# Patient Record
Sex: Male | Born: 1995 | Race: Black or African American | Hispanic: No | Marital: Single | Smoking: Never smoker
Health system: Southern US, Community
[De-identification: ages and names within clinical notes are randomized; demographics above are authoritative.]

---
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
## 2014-01-13 ENCOUNTER — Encounter (HOSPITAL_COMMUNITY): Payer: Self-pay | Admitting: Emergency Medicine

## 2014-01-13 ENCOUNTER — Emergency Department (HOSPITAL_COMMUNITY)
Admission: EM | Admit: 2014-01-13 | Discharge: 2014-01-13 | Disposition: A | Discharge status: Home / Self Care | Payer: Medicaid Other | Attending: Emergency Medicine | Admitting: Emergency Medicine

## 2014-01-13 DIAGNOSIS — R112 Nausea with vomiting, unspecified: Secondary | ICD-10-CM | POA: Insufficient documentation

## 2014-01-13 DIAGNOSIS — R197 Diarrhea, unspecified: Secondary | ICD-10-CM | POA: Insufficient documentation

## 2014-01-13 DIAGNOSIS — R000 Tachycardia, unspecified: Secondary | ICD-10-CM | POA: Insufficient documentation

## 2014-01-13 LAB — POCT I-STAT, CHEM 8
BUN: 11 mg/dL (ref 6–23)
CREATININE: 1 mg/dL (ref 0.50–1.35)
Calcium, Ion: 1.11 mmol/L — ABNORMAL LOW (ref 1.12–1.23)
Chloride: 101 mEq/L (ref 96–112)
GLUCOSE: 107 mg/dL — AB (ref 70–99)
HCT: 52 % (ref 39.0–52.0)
HEMOGLOBIN: 17.7 g/dL — AB (ref 13.0–17.0)
POTASSIUM: 3.6 meq/L — AB (ref 3.7–5.3)
Sodium: 140 mEq/L (ref 137–147)
TCO2: 23 mmol/L (ref 0–100)

## 2014-01-13 MED ORDER — ONDANSETRON HCL 4 MG/2ML IJ SOLN
4.0000 mg | Freq: Once | INTRAMUSCULAR | Status: AC
  Administered 2014-01-13: 4 mg via INTRAVENOUS
  Filled 2014-01-13: qty 2

## 2014-01-13 MED ORDER — SODIUM CHLORIDE 0.9 % IV BOLUS (SEPSIS)
2000.0000 mL | Freq: Once | INTRAVENOUS | Status: AC
  Administered 2014-01-13: 1000 mL via INTRAVENOUS

## 2014-01-13 MED ORDER — ONDANSETRON 4 MG PO TBDP: 4.0000 mg | ORAL_TABLET | Freq: Three times a day (TID) | ORAL | Status: DC | PRN

## 2014-01-13 NOTE — ED Notes (Signed)
Pt reports nausea better at this time

## 2014-01-13 NOTE — Discharge Instructions (Signed)
Use the Zofran as needed for any further episodes of vomiting Try to eat lightly and follow the diet for diarrhea for 1-2 days

## 2014-01-13 NOTE — ED Notes (Signed)
Pt c/o n/v/d x 1 days. Emesis x 2 with multiple episodes of diarrhea. Pt reports good output but little intake. BS active.

## 2014-01-13 NOTE — ED Provider Notes (Addendum)
CSN: [id number]     Arrival date & time 01/13/14  0309 History   First MD Initiated Contact with Patient 01/13/14 0321     Chief Complaint  Patient presents with  . Nausea  . Emesis     (Consider location/radiation/quality/duration/timing/severity/associated sxs/prior Treatment) HPI Comments: Alejandro Olson is an 18 year old male presents with 20 hours of nausea, vomiting, and diarrhea.  His last episode of vomiting was approximately 4 hours ago.  His last loose bowel movement was just prior to his arrival.  He has not taken any over-the-counter medication to control his symptoms.  He denies any fever, abdominal pain, previous history of the same is unaware of any ill contacts.  Has not traveled in the country  Patient is a 18 y.o. male presenting with vomiting. The history is provided by the patient.  Emesis Severity:  Moderate Duration:  20 hours Timing:  Intermittent Quality:  Bilious material Progression:  Unchanged Chronicity:  New Recent urination:  Normal Relieved by:  None tried Worsened by:  Liquids Ineffective treatments:  None tried Associated symptoms: diarrhea   Associated symptoms: no abdominal pain, no cough, no fever and no myalgias   Diarrhea:    Quality:  Semi-solid   Severity:  Moderate   Duration:  20 hours   Timing:  Intermittent   Progression:  Unchanged   History reviewed. No pertinent past medical history. History reviewed. No pertinent past surgical history. History reviewed. No pertinent family history. History  Substance Use Topics  . Smoking status: Never Smoker   . Smokeless tobacco: Not on file  . Alcohol Use: No    Review of Systems  Constitutional: Negative for fever.  Respiratory: Negative for cough.   Gastrointestinal: Positive for nausea, vomiting and diarrhea. Negative for abdominal pain.  Musculoskeletal: Negative for myalgias.  Skin: Negative for rash and wound.  All other systems reviewed and are negative.      Allergies   Review of patient's allergies indicates no known allergies.  Home Medications   Current Outpatient Rx  Name  Route  Sig  Dispense  Refill  . ondansetron (ZOFRAN ODT) 4 MG disintegrating tablet   Oral   Take 1 tablet (4 mg total) by mouth every 8 (eight) hours as needed for nausea or vomiting.   20 tablet   0    BP 127/67  Pulse 115  Temp(Src) 98.7 F (37.1 C) (Oral)  Resp 18  SpO2 97% Physical Exam  Vitals reviewed. Constitutional: He appears well-developed and well-nourished.  HENT:  Head: Normocephalic.  Eyes: Pupils are equal, round, and reactive to light.  Neck: Normal range of motion.  Cardiovascular: Regular rhythm.  Tachycardia present.   Pulmonary/Chest: Effort normal and breath sounds normal.  Abdominal: Soft. He exhibits no distension. There is no tenderness.  Musculoskeletal: Normal range of motion.  Skin: Skin is warm. No rash noted. No pallor.    ED Course  Procedures (including critical care time) Labs Review Labs Reviewed  POCT I-STAT, CHEM 8 - Abnormal; Notable for the following:    Potassium 3.6 (*)    Glucose, Bld 107 (*)    Calcium, Ion 1.11 (*)    Hemoglobin 17.7 (*)    All other components within normal limits   Imaging Review No results found.  EKG Interpretation   None       MDM   Final diagnoses:  Nausea vomiting and diarrhea   Will hydrate, given Zofran and IV fluids    This is tolerating fluids, feels,  better.  He's had no further episodes of vomiting, or diarrhea.  Since his been seen in the emergency, department.  He'll be discharged him with Zofran instructions for diarrhea diet   Arman FilterGail K Osborn Pullin, NP 01/13/14 0402  Arman FilterGail K Sybel Standish, NP 01/13/14 [phone]

## 2014-01-13 NOTE — ED Notes (Signed)
Pt arrived to the ED with a complaint of nausea, emesis and diarrhea.  Pt states he has had these symptoms fr about a day.  Pt states he has had any oral intake in that amount of time as well.

## 2014-01-13 NOTE — ED Provider Notes (Signed)
Medical screening examination/treatment/procedure(s) were performed by non-physician practitioner and as supervising physician I was immediately available for consultation/collaboration.  EKG Interpretation   None         Gordie Belvin, MD 01/13/14 0658 

## 2014-01-13 NOTE — ED Provider Notes (Signed)
Medical screening examination/treatment/procedure(s) were performed by non-physician practitioner and as supervising physician I was immediately available for consultation/collaboration.  EKG Interpretation   None         Loyd Marhefka, MD 01/13/14 0549 

## 2014-06-21 ENCOUNTER — Encounter (HOSPITAL_COMMUNITY): Payer: Self-pay | Admitting: Emergency Medicine

## 2014-06-21 ENCOUNTER — Emergency Department (HOSPITAL_COMMUNITY)
Admission: EM | Admit: 2014-06-21 | Discharge: 2014-06-21 | Disposition: A | Discharge status: Home / Self Care | Payer: Medicaid Other | Attending: Emergency Medicine | Admitting: Emergency Medicine

## 2014-06-21 ENCOUNTER — Emergency Department (HOSPITAL_COMMUNITY): Payer: Medicaid Other

## 2014-06-21 DIAGNOSIS — S6990XA Unspecified injury of unspecified wrist, hand and finger(s), initial encounter: Secondary | ICD-10-CM | POA: Diagnosis present

## 2014-06-21 DIAGNOSIS — Y92838 Other recreation area as the place of occurrence of the external cause: Secondary | ICD-10-CM

## 2014-06-21 DIAGNOSIS — W1801XA Striking against sports equipment with subsequent fall, initial encounter: Secondary | ICD-10-CM | POA: Insufficient documentation

## 2014-06-21 DIAGNOSIS — S63502A Unspecified sprain of left wrist, initial encounter: Secondary | ICD-10-CM

## 2014-06-21 DIAGNOSIS — Y9239 Other specified sports and athletic area as the place of occurrence of the external cause: Secondary | ICD-10-CM | POA: Insufficient documentation

## 2014-06-21 DIAGNOSIS — S63509A Unspecified sprain of unspecified wrist, initial encounter: Secondary | ICD-10-CM | POA: Insufficient documentation

## 2014-06-21 DIAGNOSIS — Y9367 Activity, basketball: Secondary | ICD-10-CM | POA: Insufficient documentation

## 2014-06-21 MED ORDER — IBUPROFEN 600 MG PO TABS: 600.0000 mg | ORAL_TABLET | Freq: Four times a day (QID) | ORAL | Status: DC | PRN

## 2014-06-21 NOTE — ED Provider Notes (Signed)
CSN: [id number]     Arrival date & time 06/21/14  1539 History  This chart was scribed for non-physician practitioner working with Rolan BuccoMelanie Belfi, MD by Elveria Risingimelie Horne, ED Scribe. This patient was seen in room TR05C/TR05C and the patient's care was started at 4:11 PM.   Chief Complaint  Patient presents with  . Hand Injury      The history is provided by the patient. No language interpreter was used.   HPI Comments: Marzetta MerinoMatthew D Sonoda is a 18 y.o. male who presents to the Emergency Department with a left hand injury that occurred one week ago. Patient reports falling directly on his hand while playing basketball. Patient reports that the pain resolved but recently represented, prompting his ED visit today.  Patient reports exacerbated pain with movement and picking up objects. Keeping it still makes it better. Patient has not taken any pain medication but has treated the hand with ice. Patient denies previous injury to the hand.   History reviewed. No pertinent past medical history. History reviewed. No pertinent past surgical history. History reviewed. No pertinent family history. History  Substance Use Topics  . Smoking status: Never Smoker   . Smokeless tobacco: Not on file  . Alcohol Use: No    Review of Systems  Constitutional: Negative for fever.  Musculoskeletal: Positive for arthralgias.  Neurological: Negative for weakness and numbness.      Allergies  Review of patient's allergies indicates no known allergies.  Home Medications   Prior to Admission medications   Medication Sig Start Date End Date Taking? Authorizing Provider  ondansetron (ZOFRAN ODT) 4 MG disintegrating tablet Take 1 tablet (4 mg total) by mouth every 8 (eight) hours as needed for nausea or vomiting. 01/13/14   Arman FilterGail K Schulz, NP   Triage Vitals: BP 123/64  Pulse 60  Temp(Src) 98.6 F (37 C) (Oral)  Resp 20  Wt 189 lb (85.73 kg)  SpO2 100% Physical Exam  Nursing note and vitals  reviewed. Constitutional: He is oriented to person, place, and time. He appears well-developed and well-nourished. No distress.  HENT:  Head: Normocephalic and atraumatic.  Eyes: EOM are normal.  Neck: Neck supple.  Cardiovascular: Normal rate.   Pulmonary/Chest: Effort normal.  Musculoskeletal: Normal range of motion. He exhibits tenderness.  Swelling noted to the dorsal left wrist. Tender to palpation over dorsal wrist. Pain with any flexion or extension at the wrist joint. Tender over the scaphoid bone. Pain with range of motion of the thumb at MCP joint. Normal hand otherwise, with no tenderness over metacarpals or phalanges. Normal sensation of the hand distally with cap refill less than 2 seconds distally.  Neurological: He is alert and oriented to person, place, and time.  Skin: Skin is warm and dry.  Psychiatric: He has a normal mood and affect. His behavior is normal.    ED Course  Procedures (including critical care time) COORDINATION OF CARE: 4:14 PM- Will order X-ray. Discussed treatment plan with patient at bedside and patient agreed to plan.   Labs Review Labs Reviewed - No data to display  Imaging Review Dg Wrist Complete Left  06/21/2014   CLINICAL DATA:  Larey SeatFell this afternoon, pain at base of thumb to distal radius  EXAM: LEFT WRIST - COMPLETE 3+ VIEW  COMPARISON:  None.  FINDINGS: There is no evidence of fracture or dislocation. There is no evidence of arthropathy or other focal bone abnormality. Soft tissues are unremarkable.  IMPRESSION: Negative.   Electronically Signed  By: Esperanza Heir M.D.   On: 06/21/2014 16:47     EKG Interpretation None      MDM   Final diagnoses:  Wrist sprain, left, initial encounter    Pt with left wrist injury 1 wk ago. Xrays negative. He is neurovascularly intact. He is tender over scaphoid, however injury occurred a week ago and today's films are negative. Will splint in velcro thumb spika. Follow up with hand orthopedics.    Filed Vitals:   06/21/14 1552  BP: 123/64  Pulse: 60  Temp: 98.6 F (37 C)  Resp: 20     I personally performed the services described in this documentation, which was scribed in my presence. The recorded information has been reviewed and is accurate.    Lottie Mussel, PA-C 06/21/14 1709

## 2014-06-21 NOTE — Discharge Instructions (Signed)
Keep splint on. Continue to ice, elevate. Ibuprofen for pain. Follow up with hand orthopedics specialist as referred if not improving.    Joint Sprain A sprain is a tear or stretch in the ligaments that hold a joint together. Severe sprains may need as long as 3-6 weeks of immobilization and/or exercises to heal completely. Sprained joints should be rested and protected. If not, they can become unstable and prone to re-injury. Proper treatment can reduce your pain, shorten the period of disability, and reduce the risk of repeated injuries. TREATMENT   Rest and elevate the injured joint to reduce pain and swelling.  Apply ice packs to the injury for 20-30 minutes every 2-3 hours for the next 2-3 days.  Keep the injury wrapped in a compression bandage or splint as long as the joint is painful or as instructed by your caregiver.  Do not use the injured joint until it is completely healed to prevent re-injury and chronic instability. Follow the instructions of your caregiver.  Long-term sprain management may require exercises and/or treatment by a physical therapist. Taping or special braces may help stabilize the joint until it is completely better. SEEK MEDICAL CARE IF:   You develop increased pain or swelling of the joint.  You develop increasing redness and warmth of the joint.  You develop a fever.  It becomes stiff.  Your hand or foot gets cold or numb. Document Released: 12/22/2004 Document Revised: 02/06/2012 Document Reviewed: 12/01/2008 [facility] Patient Information 2015 [place], [place]. This information is not intended to replace advice given to you by your health care provider. Make sure you discuss any questions you have with your health care provider.

## 2014-06-21 NOTE — ED Notes (Signed)
Pt in stating that he fell on his left hand while playing basketball last week, reports intermittent pain since that time, no deformity noted

## 2014-06-21 NOTE — ED Notes (Signed)
Declined W/C at D/C and was escorted to lobby by RN. 

## 2014-06-22 NOTE — ED Provider Notes (Signed)
Medical screening examination/treatment/procedure(s) were performed by non-physician practitioner and as supervising physician I was immediately available for consultation/collaboration.   EKG Interpretation None        Anquanette Bahner, MD 06/22/14 0008 

## 2018-07-23 ENCOUNTER — Ambulatory Visit (HOSPITAL_COMMUNITY)
Admission: EM | Admit: 2018-07-23 | Discharge: 2018-07-23 | Disposition: A | Discharge status: Home / Self Care | Payer: Self-pay | Attending: Physician Assistant | Admitting: Physician Assistant

## 2018-07-23 ENCOUNTER — Encounter (HOSPITAL_COMMUNITY): Payer: Self-pay

## 2018-07-23 DIAGNOSIS — Z113 Encounter for screening for infections with a predominantly sexual mode of transmission: Secondary | ICD-10-CM

## 2018-07-23 DIAGNOSIS — Z202 Contact with and (suspected) exposure to infections with a predominantly sexual mode of transmission: Secondary | ICD-10-CM | POA: Insufficient documentation

## 2018-07-23 MED ORDER — AZITHROMYCIN 250 MG PO TABS
1000.0000 mg | ORAL_TABLET | Freq: Once | ORAL | Status: AC
  Administered 2018-07-23: 1000 mg via ORAL

## 2018-07-23 MED ORDER — LIDOCAINE HCL (PF) 1 % IJ SOLN
INTRAMUSCULAR | Status: AC
  Filled 2018-07-23: qty 2

## 2018-07-23 MED ORDER — AZITHROMYCIN 250 MG PO TABS
ORAL_TABLET | ORAL | Status: AC
  Filled 2018-07-23: qty 4

## 2018-07-23 MED ORDER — CEFTRIAXONE SODIUM 250 MG IJ SOLR
INTRAMUSCULAR | Status: AC
  Filled 2018-07-23: qty 250

## 2018-07-23 MED ORDER — CEFTRIAXONE SODIUM 250 MG IJ SOLR
250.0000 mg | Freq: Once | INTRAMUSCULAR | Status: AC
Start: 2018-07-23 — End: 2018-07-23
  Administered 2018-07-23: 250 mg via INTRAMUSCULAR

## 2018-07-23 NOTE — ED Triage Notes (Signed)
Pt presents to have a STD testing after exposure with partner; not displaying any symptoms.

## 2018-07-23 NOTE — ED Provider Notes (Signed)
[facility]    CSN: [id number] Arrival date & time: 07/23/18  1600     History   Chief Complaint Chief Complaint  Patient presents with  . Exposure to STD    HPI IRVAN TIEDT is a 22 y.o. male.   22 year old male comes in for STD testing.  Patient states partner was tested positive for chlamydia.  He is asymptomatic.  Denies fever, chills, night sweats.  Denies abdominal pain, nausea, vomiting.  Denies penile discharge, testicular swelling, testicular pain, penile lesion.  Denies urinary symptoms such as frequency, dysuria, hematuria.  He is sexually active with one male partner, no condom use.     History reviewed. No pertinent past medical history.  There are no active problems to display for this patient.   History reviewed. No pertinent surgical history.     Home Medications    Prior to Admission medications   Medication Sig Start Date End Date Taking? Authorizing Provider  ibuprofen (ADVIL,MOTRIN) 600 MG tablet Take 1 tablet (600 mg total) by mouth every 6 (six) hours as needed. 06/21/14   Kirichenko, Tatyana, PA-C  ondansetron (ZOFRAN ODT) 4 MG disintegrating tablet Take 1 tablet (4 mg total) by mouth every 8 (eight) hours as needed for nausea or vomiting. 01/13/14   Earley Favor, NP    Family History History reviewed. No pertinent family history.  Social History Social History   Tobacco Use  . Smoking status: Never Smoker  Substance Use Topics  . Alcohol use: No  . Drug use: No     Allergies   Patient has no known allergies.   Review of Systems Review of Systems  Reason unable to perform ROS: See HPI as above.     Physical Exam Triage Vital Signs ED Triage Vitals  Enc Vitals Group     BP 07/23/18 1623 128/78     Pulse Rate 07/23/18 1623 61     Resp 07/23/18 1623 20     Temp 07/23/18 1623 98.3 F (36.8 C)     Temp Source 07/23/18 1623 Temporal     SpO2 07/23/18 1623 100 %     Weight --      Height --      Head  Circumference --      Peak Flow --      Pain Score 07/23/18 1622 0     Pain Loc --      Pain Edu? --      Excl. in GC? --    No data found.  Updated Vital Signs BP 128/78 (BP Location: Right Arm)   Pulse 61   Temp 98.3 F (36.8 C) (Temporal)   Resp 20   SpO2 100%   Physical Exam  Constitutional: He is oriented to person, place, and time. He appears well-developed and well-nourished. No distress.  HENT:  Head: Normocephalic and atraumatic.  Eyes: Pupils are equal, round, and reactive to light. Conjunctivae are normal.  Neurological: He is alert and oriented to person, place, and time.  Skin: He is not diaphoretic.     UC Treatments / Results  Labs (all labs ordered are listed, but only abnormal results are displayed) Labs Reviewed  URINE CYTOLOGY ANCILLARY ONLY    EKG None  Radiology No results found.  Procedures Procedures (including critical care time)  Medications Ordered in UC Medications  azithromycin (ZITHROMAX) tablet 1,000 mg (1,000 mg Oral Given 07/23/18 1721)  cefTRIAXone (ROCEPHIN) injection 250 mg (250 mg Intramuscular Given 07/23/18 1721)  Initial Impression / Assessment and Plan / UC Course  I have reviewed the triage vital signs and the nursing notes.  Pertinent labs & imaging results that were available during my care of the patient were reviewed by me and considered in my medical decision making (see chart for details).    Patient was treated empirically for GC. Azithromycin and Rocephin given in office today. Cytology sent, patient will be contacted with any positive results that require additional treatment. Patient to refrain from sexual activity for the next 7 days. Return precautions given.   Final Clinical Impressions(s) / UC Diagnoses   Final diagnoses:  Exposure to STD    ED Prescriptions    None        Belinda FisherYu, Jerrine Urschel V, PA-C 07/23/18 1738

## 2018-07-23 NOTE — Discharge Instructions (Signed)
You were treated empirically for gonorrhea, chlamydia. Azithromycin 1g by mouth and Rocephin 250mg  injection given in office today. Cytology sent, you will be contacted with any positive results that requires further treatment. Refrain from sexual activity for the next 7 days. Monitor for any worsening of symptoms, fever, abdominal pain, nausea, vomiting, testicle swelling/pain, penile lesion/sore to follow up for reevaluation.

## 2018-07-24 LAB — URINE CYTOLOGY ANCILLARY ONLY
Chlamydia: NEGATIVE
NEISSERIA GONORRHEA: NEGATIVE
Trichomonas: NEGATIVE

## 2019-11-03 ENCOUNTER — Emergency Department (HOSPITAL_COMMUNITY)
Admission: EM | Admit: 2019-11-03 | Discharge: 2019-11-04 | Disposition: A | Discharge status: Home / Self Care | Payer: Self-pay | Attending: Emergency Medicine | Admitting: Emergency Medicine

## 2019-11-03 ENCOUNTER — Emergency Department (HOSPITAL_COMMUNITY): Payer: Self-pay

## 2019-11-03 ENCOUNTER — Other Ambulatory Visit: Payer: Self-pay

## 2019-11-03 ENCOUNTER — Encounter (HOSPITAL_COMMUNITY): Payer: Self-pay | Admitting: Emergency Medicine

## 2019-11-03 DIAGNOSIS — R0781 Pleurodynia: Secondary | ICD-10-CM | POA: Insufficient documentation

## 2019-11-03 DIAGNOSIS — N342 Other urethritis: Secondary | ICD-10-CM | POA: Insufficient documentation

## 2019-11-03 LAB — URINALYSIS, ROUTINE W REFLEX MICROSCOPIC
Bacteria, UA: NONE SEEN
Bilirubin Urine: NEGATIVE
Glucose, UA: NEGATIVE mg/dL
Ketones, ur: NEGATIVE mg/dL
Nitrite: NEGATIVE
Protein, ur: 100 mg/dL — AB
Specific Gravity, Urine: 1.03 (ref 1.005–1.030)
WBC, UA: >50 WBC/hpf — ABNORMAL HIGH (ref 0–5)
pH: 5 (ref 5.0–8.0)

## 2019-11-03 NOTE — ED Triage Notes (Signed)
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 Pt states pain worse today with at work, pain reproducable with palpation. Denies shob.  Pt states he urinated today he noted bright red streaks in urine, +dysuria.

## 2019-11-04 MED ORDER — IBUPROFEN 800 MG PO TABS: 800.0000 mg | ORAL_TABLET | Freq: Three times a day (TID) | ORAL | 0 refills | Status: DC

## 2019-11-04 MED ORDER — LIDOCAINE HCL (PF) 1 % IJ SOLN
INTRAMUSCULAR | Status: AC
  Filled 2019-11-04: qty 5

## 2019-11-04 MED ORDER — AZITHROMYCIN 250 MG PO TABS
1000.0000 mg | ORAL_TABLET | Freq: Once | ORAL | Status: AC
  Administered 2019-11-04: 1000 mg via ORAL
  Filled 2019-11-04: qty 4

## 2019-11-04 MED ORDER — CEFTRIAXONE SODIUM 250 MG IJ SOLR
250.0000 mg | Freq: Once | INTRAMUSCULAR | Status: AC
  Administered 2019-11-04: 250 mg via INTRAMUSCULAR
  Filled 2019-11-04: qty 250

## 2019-11-04 NOTE — ED Provider Notes (Signed)
[facility] EMERGENCY DEPARTMENT Provider Note   CSN: [id number] Arrival date & time: 11/03/19  2036     History   Chief Complaint Chief Complaint  Patient presents with  . Hematuria    HPI Alejandro Olson is a 23 y.o. male.     Presents to the emergency department with a chief complaint of 2 complaints.  1.  Right rib pain: Patient works at [employer], he is constantly moving and twisting and lifting boxes.  He states that he noticed he was having some pain on his right ribs yesterday.  It is reproducible with palpation.  It is worsened with movement.  He denies any known injury.  He denies any fever, chills, cough, or shortness of breath.  He denies any abdominal pain.  2.  Hematuria: Patient reports penile discharge and hematuria that started 2 days ago.  He is sexually active.  Denies any abdominal pain.  Denies any fevers or chills.  Denies any treatments prior to arrival.  The history is provided by the patient. No language interpreter was used.    History reviewed. No pertinent past medical history.  There are no active problems to display for this patient.   History reviewed. No pertinent surgical history.      Home Medications    Prior to Admission medications   Medication Sig Start Date End Date Taking? Authorizing Provider  ibuprofen (ADVIL,MOTRIN) 600 MG tablet Take 1 tablet (600 mg total) by mouth every 6 (six) hours as needed. Patient not taking: Reported on 11/04/2019 06/21/14   Jaynie Crumble, PA-C  ondansetron (ZOFRAN ODT) 4 MG disintegrating tablet Take 1 tablet (4 mg total) by mouth every 8 (eight) hours as needed for nausea or vomiting. Patient not taking: Reported on 11/04/2019 01/13/14   Earley Favor, NP    Family History No family history on file.  Social History Social History   Tobacco Use  . Smoking status: Never Smoker  . Smokeless tobacco: Never Used  Substance Use Topics  . Alcohol use: No  . Drug use: No      Allergies   Patient has no known allergies.   Review of Systems Review of Systems  All other systems reviewed and are negative.    Physical Exam Updated Vital Signs BP (!) 153/84 (BP Location: Right Arm)   Pulse 83   Temp 98.4 F (36.9 C) (Oral)   Resp 16   Ht 6\' 1"  (1.854 m)   Wt 85 kg   SpO2 95%   BMI 24.72 kg/m   Physical Exam Vitals signs and nursing note reviewed.  Constitutional:      General: He is not in acute distress.    Appearance: He is well-developed. He is not ill-appearing.  HENT:     Head: Normocephalic and atraumatic.  Eyes:     Conjunctiva/sclera: Conjunctivae normal.  Neck:     Musculoskeletal: Neck supple.  Cardiovascular:     Rate and Rhythm: Normal rate.  Pulmonary:     Effort: Pulmonary effort is normal. No respiratory distress.     Comments: Lung sounds are clear bilaterally Right anterior chest wall is tender to palpation, but no ecchymosis Abdominal:     General: There is no distension.     Comments: Abdomen is nontender, no Murphy sign  Genitourinary:    Comments: Deferred Musculoskeletal: Normal range of motion.     Comments: Moves all extremities  Skin:    General: Skin is warm and dry.  Comments: No skin rash  Neurological:     Mental Status: He is alert and oriented to person, place, and time.  Psychiatric:        Mood and Affect: Mood normal.        Behavior: Behavior normal.      ED Treatments / Results  Labs (all labs ordered are listed, but only abnormal results are displayed) Labs Reviewed  URINALYSIS, ROUTINE W REFLEX MICROSCOPIC - Abnormal; Notable for the following components:      Result Value   Color, Urine AMBER (*)    APPearance CLOUDY (*)    Hgb urine dipstick MODERATE (*)    Protein, ur 100 (*)    Leukocytes,Ua MODERATE (*)    WBC, UA >50 (*)    All other components within normal limits    EKG None  Radiology Dg Chest 2 View  Result Date: 11/03/2019 CLINICAL DATA:  23 year old male with  right sided chest pain. EXAM: CHEST - 2 VIEW COMPARISON:  Chest radiograph dated 05/08/2005. FINDINGS: The lungs are clear. There is no pleural effusion or pneumothorax. The cardiac silhouette is within normal limits. No acute osseous pathology. IMPRESSION: No active cardiopulmonary disease. Electronically Signed   By: Anner Crete M.D.   On: 11/03/2019 21:25    Procedures Procedures (including critical care time)  Medications Ordered in ED Medications  azithromycin (ZITHROMAX) tablet 1,000 mg (has no administration in time range)  cefTRIAXone (ROCEPHIN) injection 250 mg (has no administration in time range)     Initial Impression / Assessment and Plan / ED Course  I have reviewed the triage vital signs and the nursing notes.  Pertinent labs & imaging results that were available during my care of the patient were reviewed by me and considered in my medical decision making (see chart for details).        Patient with right rib pain.  He works at [employer].  Suspect muscle strain.  Chest x-ray is negative for rib fracture.  No evidence of infection.  Skin is normal in appearance.  His symptoms are reproducible.  Will treat with NSAIDs.  Patient also has hematuria and penile discharge.  Will treat for STDs.  Final Clinical Impressions(s) / ED Diagnoses   Final diagnoses:  Rib pain on right side  Urethritis    ED Discharge Orders         Ordered    ibuprofen (ADVIL) 800 MG tablet  3 times daily     11/04/19 0033           Montine Circle, PA-C 11/04/19 0033    Ward, Delice Bison, DO 11/04/19 [phone]

## 2019-11-04 NOTE — Discharge Instructions (Addendum)
Please take ibuprofen as directed.  If you develop fever, chills, cough, or shortness of breath return to the emergency department.  Additionally if you have upper abdominal pain, nausea, vomiting, return for repeat evaluation.  We believe your symptoms to likely be muscle strain.  You may apply ice.  Be careful lifting.

## 2020-01-02 ENCOUNTER — Encounter (HOSPITAL_COMMUNITY): Payer: Self-pay | Admitting: Emergency Medicine

## 2020-01-02 ENCOUNTER — Emergency Department (HOSPITAL_COMMUNITY): Payer: Medicaid Other

## 2020-01-02 ENCOUNTER — Emergency Department (HOSPITAL_COMMUNITY)
Admission: EM | Admit: 2020-01-02 | Discharge: 2020-01-02 | Disposition: A | Discharge status: Home / Self Care | Payer: Medicaid Other | Attending: Emergency Medicine | Admitting: Emergency Medicine

## 2020-01-02 ENCOUNTER — Other Ambulatory Visit: Payer: Self-pay

## 2020-01-02 DIAGNOSIS — M25561 Pain in right knee: Secondary | ICD-10-CM | POA: Insufficient documentation

## 2020-01-02 DIAGNOSIS — Y929 Unspecified place or not applicable: Secondary | ICD-10-CM | POA: Insufficient documentation

## 2020-01-02 DIAGNOSIS — M545 Low back pain, unspecified: Secondary | ICD-10-CM

## 2020-01-02 DIAGNOSIS — S99912A Unspecified injury of left ankle, initial encounter: Secondary | ICD-10-CM | POA: Insufficient documentation

## 2020-01-02 DIAGNOSIS — R369 Urethral discharge, unspecified: Secondary | ICD-10-CM | POA: Insufficient documentation

## 2020-01-02 DIAGNOSIS — M25562 Pain in left knee: Secondary | ICD-10-CM | POA: Insufficient documentation

## 2020-01-02 DIAGNOSIS — Y999 Unspecified external cause status: Secondary | ICD-10-CM | POA: Insufficient documentation

## 2020-01-02 DIAGNOSIS — X500XXA Overexertion from strenuous movement or load, initial encounter: Secondary | ICD-10-CM | POA: Insufficient documentation

## 2020-01-02 DIAGNOSIS — Y939 Activity, unspecified: Secondary | ICD-10-CM | POA: Insufficient documentation

## 2020-01-02 LAB — URINALYSIS, ROUTINE W REFLEX MICROSCOPIC
Bilirubin Urine: NEGATIVE
Glucose, UA: NEGATIVE mg/dL
Ketones, ur: NEGATIVE mg/dL
Nitrite: NEGATIVE
Protein, ur: 100 mg/dL — AB
Specific Gravity, Urine: 1.03 (ref 1.005–1.030)
WBC, UA: >50 WBC/hpf — ABNORMAL HIGH (ref 0–5)
pH: 5 (ref 5.0–8.0)

## 2020-01-02 MED ORDER — CEFTRIAXONE SODIUM 500 MG IJ SOLR
500.0000 mg | Freq: Once | INTRAMUSCULAR | Status: AC
  Administered 2020-01-02: 22:00:00 500 mg via INTRAMUSCULAR
  Filled 2020-01-02: qty 500

## 2020-01-02 MED ORDER — DOXYCYCLINE HYCLATE 100 MG PO TABS
100.0000 mg | ORAL_TABLET | Freq: Once | ORAL | Status: AC
  Administered 2020-01-02: 22:00:00 100 mg via ORAL
  Filled 2020-01-02: qty 1

## 2020-01-02 MED ORDER — METHOCARBAMOL 500 MG PO TABS: 500.0000 mg | ORAL_TABLET | Freq: Two times a day (BID) | ORAL | 0 refills | Status: DC

## 2020-01-02 MED ORDER — DOXYCYCLINE HYCLATE 100 MG PO CAPS: 100.0000 mg | ORAL_CAPSULE | Freq: Two times a day (BID) | ORAL | 0 refills | Status: AC

## 2020-01-02 MED ORDER — LIDOCAINE 5 % EX PTCH: 1.0000 | MEDICATED_PATCH | CUTANEOUS | 0 refills | Status: DC

## 2020-01-02 NOTE — ED Provider Notes (Signed)
[facility] EMERGENCY DEPARTMENT Provider Note   CSN: [id number] Arrival date & time: 01/02/20  1843     History Chief Complaint  Patient presents with  . Dysuria  . Back Pain    Alejandro Olson is a 24 y.o. male.  HPI      Alejandro Olson is a 23 y.o. male, with a history of chlamydia, presenting to the ED primarily complaining of dysuria and penile discharge for the past 2 days. He is sexually active with male partners. Denies fever/chills, abdominal pain, testicular/scrotal pain/swelling, hematuria, nausea/vomiting, pain with bowel movements.  Patient also complains of lower back pain and bilateral knee pain for about the last 3 weeks.  He also complains of left ankle pain for about the last 2 weeks which occurred after twisting his ankle stepping off the truck.  Patient works at [employer] as a Education officer, community.  Denies saddle anesthesias, numbness, weakness, or any other complaints.   History reviewed. No pertinent past medical history.  There are no problems to display for this patient.   History reviewed. No pertinent surgical history.     History reviewed. No pertinent family history.  Social History   Tobacco Use  . Smoking status: Never Smoker  . Smokeless tobacco: Never Used  Substance Use Topics  . Alcohol use: No  . Drug use: No    Home Medications Prior to Admission medications   Medication Sig Start Date End Date Taking? Authorizing Provider  doxycycline (VIBRAMYCIN) 100 MG capsule Take 1 capsule (100 mg total) by mouth 2 (two) times daily for 7 days. 01/02/20 01/09/20  Zaine Elsass C, PA-C  ibuprofen (ADVIL) 800 MG tablet Take 1 tablet (800 mg total) by mouth 3 (three) times daily. 11/04/19   Montine Circle, PA-C  lidocaine (LIDODERM) 5 % Place 1 patch onto the skin daily. Remove & Discard patch within 12 hours or as directed by MD 01/02/20   Tatiyana Foucher C, PA-C  methocarbamol (ROBAXIN) 500 MG tablet Take 1 tablet (500 mg total) by mouth 2  (two) times daily. 01/02/20   Joeleen Wortley C, PA-C    Allergies    Patient has no known allergies.  Review of Systems   Review of Systems  Constitutional: Negative for chills, diaphoresis and fever.  Respiratory: Negative for shortness of breath.   Cardiovascular: Negative for chest pain.  Gastrointestinal: Negative for abdominal pain, nausea and vomiting.  Genitourinary: Positive for discharge and dysuria. Negative for flank pain, genital sores, penile swelling and scrotal swelling.  Musculoskeletal: Positive for back pain.  Neurological: Negative for weakness and numbness.  All other systems reviewed and are negative.   Physical Exam Updated Vital Signs BP 133/78 (BP Location: Left Arm)   Pulse 77   Temp 100.1 F (37.8 C) (Oral)   Resp 16   SpO2 100%   Physical Exam Vitals and nursing note reviewed.  Constitutional:      General: He is not in acute distress.    Appearance: He is well-developed. He is not diaphoretic.  HENT:     Head: Normocephalic and atraumatic.     Mouth/Throat:     Mouth: Mucous membranes are moist.     Pharynx: Oropharynx is clear.  Eyes:     Conjunctiva/sclera: Conjunctivae normal.  Cardiovascular:     Rate and Rhythm: Normal rate and regular rhythm.     Pulses: Normal pulses.          Radial pulses are 2+ on the right side  and 2+ on the left side.       Posterior tibial pulses are 2+ on the right side and 2+ on the left side.     Heart sounds: Normal heart sounds.     Comments: Tactile temperature in the extremities appropriate and equal bilaterally. Pulmonary:     Effort: Pulmonary effort is normal. No respiratory distress.     Breath sounds: Normal breath sounds.  Abdominal:     Palpations: Abdomen is soft.     Tenderness: There is no abdominal tenderness. There is no guarding.  Genitourinary:    Penis: Discharge present. No erythema, tenderness, swelling or lesions.      Testes: Normal.  Musculoskeletal:     Cervical back: Neck supple.       Lumbar back: Tenderness present.       Back:     Right knee: No swelling, deformity, effusion, erythema or crepitus. Normal range of motion. No tenderness.     Left knee: No swelling, deformity, effusion, erythema or crepitus. Normal range of motion. No tenderness.     Right lower leg: No edema.     Left lower leg: No edema.     Right ankle: Normal.     Left ankle: No swelling, deformity or ecchymosis. Tenderness present over the lateral malleolus and medial malleolus.     Comments: Right knee: No tenderness, swelling, deformity, instability, or pain with range of motion.  Some pain with weightbearing. Left knee: No tenderness, swelling, deformity, instability, or pain with range of motion.  Some pain with weightbearing.  Lymphadenopathy:     Cervical: No cervical adenopathy.     Lower Body: No right inguinal adenopathy. No left inguinal adenopathy.  Skin:    General: Skin is warm and dry.  Neurological:     Mental Status: He is alert.     Comments: Sensation grossly intact to light touch in the lower extremities bilaterally. No saddle anesthesias. Strength 5/5 in the bilateral lower extremities. No noted gait deficit. Coordination intact.  Psychiatric:        Mood and Affect: Mood and affect normal.        Speech: Speech normal.        Behavior: Behavior normal.     ED Results / Procedures / Treatments   Labs (all labs ordered are listed, but only abnormal results are displayed) Labs Reviewed  URINALYSIS, ROUTINE W REFLEX MICROSCOPIC - Abnormal; Notable for the following components:      Result Value   APPearance CLOUDY (*)    Hgb urine dipstick MODERATE (*)    Protein, ur 100 (*)    Leukocytes,Ua MODERATE (*)    WBC, UA >50 (*)    Bacteria, UA FEW (*)    All other components within normal limits  URINE CULTURE  RPR  HIV ANTIBODY (ROUTINE TESTING W REFLEX)  GC/CHLAMYDIA PROBE AMP ([place]) NOT AT [facility]    EKG None  Radiology DG Ankle Complete  Left  Result Date: 01/02/2020 CLINICAL DATA:  Pain. EXAM: LEFT ANKLE COMPLETE - 3+ VIEW COMPARISON:  None. FINDINGS: There is mild soft tissue swelling about the ankle without evidence for an acute displaced fracture or dislocation. IMPRESSION: No evidence for an acute displaced fracture or dislocation. Electronically Signed   By: Katherine Mantle M.D.   On: 01/02/2020 21:52    Procedures Procedures (including critical care time)  Medications Ordered in ED Medications  cefTRIAXone (ROCEPHIN) injection 500 mg (500 mg Intramuscular Given 01/02/20 2213)  doxycycline (  VIBRA-TABS) tablet 100 mg (100 mg Oral Given 01/02/20 2214)    ED Course  I have reviewed the triage vital signs and the nursing notes.  Pertinent labs & imaging results that were available during my care of the patient were reviewed by me and considered in my medical decision making (see chart for details).    MDM Rules/Calculators/A&P                      Patient presents with dysuria and penile discharge.  Suspicion for STD.  Patient treated accordingly.  Patient has atraumatic back and knee pain.  There is not an indication for imaging of these locations.  Possibly traumatic ankle pain.  No acute abnormality on x-ray.  The patient was given instructions for home care as well as return precautions. Patient voices understanding of these instructions, accepts the plan, and is comfortable with discharge.  Final Clinical Impression(s) / ED Diagnoses Final diagnoses:  Acute bilateral low back pain without sciatica  Penile discharge  Acute pain of both knees  Injury of left ankle, initial encounter    Rx / DC Orders ED Discharge Orders         Ordered    doxycycline (VIBRAMYCIN) 100 MG capsule  2 times daily     01/02/20 2131    methocarbamol (ROBAXIN) 500 MG tablet  2 times daily     01/02/20 2131    lidocaine (LIDODERM) 5 %  Every 24 hours     01/02/20 2131           Concepcion Living 01/02/20 2323     Terrilee Files, MD 01/03/20 1017

## 2020-01-02 NOTE — Progress Notes (Signed)
Orthopedic Tech Progress Note Patient Details:  Alejandro Olson 11-18-1996 [id number]  Ortho Devices Type of Ortho Device: Knee Sleeve, ASO Ortho Device/Splint Location: Bilateral Ortho Device/Splint Interventions: Ordered, Application, Adjustment   Post Interventions Patient Tolerated: Well Instructions Provided: Adjustment of device, Poper ambulation with device   Ancil Linsey 01/02/2020, 11:20 PM

## 2020-01-02 NOTE — ED Triage Notes (Signed)
Patient arrives to ED with complaints of two days of dysuria. Patient stated he did have unprotected sex last week but denies redness or discharge. Patient also states he's been having lower back pain, knee pain, and ankle pain for the last month.

## 2020-01-02 NOTE — Discharge Instructions (Addendum)
[facility] on [address]  You have been tested for STDs. Some of these results are still pending. Any abnormalities will be called to you. You have been empirically treated for gonorrhea and chlamydia. This does not mean you necessarily have these diseases, treatment is precautionary. Be sure to follow safe sex practices, including monogamy and/or condom use. All sexual partners must also be notified and treated.  Any future STD testing or treatment should be performed at the [facility] department or primary care office. For the treatment to be fully effective, avoid all sexual contact for at least 2 weeks after medication administration. Otherwise, you will infect your partner(s) and/or risk reinfecting yourself. Should symptoms fail to improve within 1 week, follow up with a primary care provider or go to the [facility].  Back and knee pain Take it easy, but do not lay around too much as this may make any stiffness worse.  Antiinflammatory medications: Take 600 mg of ibuprofen every 6 hours or 440 mg (over the counter dose) to 500 mg (prescription dose) of naproxen every 12 hours for the next 3 days. After this time, these medications may be used as needed for pain. Take these medications with food to avoid upset stomach. Choose only one of these medications, do not take them together. Acetaminophen (generic for Tylenol): Should you continue to have additional pain while taking the ibuprofen or naproxen, you may add in acetaminophen as needed. Your daily total maximum amount of acetaminophen from all sources should be limited to 4000mg /day for persons without liver problems, or 2000mg /day for those with liver problems. Methocarbamol: Methocarbamol (generic for Robaxin) is a muscle relaxer and can help relieve stiff muscles or muscle spasms.  Do not drive or perform other dangerous activities while taking this medication as it can cause drowsiness as well as changes in reaction time and  judgement. Lidocaine patches: These are available via either prescription or over-the-counter. The over-the-counter option may be more economical one and are likely just as effective. There are multiple over-the-counter brands, such as Salonpas. Ice: May apply ice to the area over the next 24 hours for 15 minutes at a time to reduce pain, inflammation, and swelling, if present. Exercises: Be sure to perform the attached exercises starting with three times a week and working up to performing them daily. This is an essential part of preventing long term problems.  Follow up: Follow up with a primary care provider for any future management of these complaints. Be sure to follow up within 7-10 days. Return: Return to the ED should symptoms worsen.  For prescription assistance, may try using prescription discount sites or apps, such as goodrx.com   Ankle Injury You have been seen today for an ankle injury. There were no acute abnormalities on the x-rays, including no sign of fracture or dislocation, however, there could be injuries to the soft tissues, such as the ligaments or tendons that are not seen on xrays. There could also be what are called occult fractures that are small fractures not seen on xray. Antiinflammatory medications: Take 600 mg of ibuprofen every 6 hours or 440 mg (over the counter dose) to 500 mg (prescription dose) of naproxen every 12 hours for the next 3 days. After this time, these medications may be used as needed for pain. Take these medications with food to avoid upset stomach. Choose only one of these medications, do not take them together. Acetaminophen (generic for Tylenol): Should you continue to have additional pain while  taking the ibuprofen or naproxen, you may add in acetaminophen as needed. Your daily total maximum amount of acetaminophen from all sources should be limited to 4000mg /day for persons without liver problems, or 2000mg /day for those with liver problems. Ice:  May apply ice to the area over the next 24 hours for 15 minutes at a time to reduce swelling. Elevation: Keep the extremity elevated as often as possible to reduce pain and inflammation. Support: Wear the ankle brace for support and comfort. Wear this until pain resolves. You will be weight-bearing as tolerated, which means you can slowly start to put weight on the extremity and increase amount and frequency as pain allows. Exercises: Start by performing these exercises a few times a week, increasing the frequency until you are performing them twice daily.  Follow up: If symptoms are improving, you may follow up with your primary care provider for any continued management. If symptoms are not starting to improve within a week, you should follow up with the orthopedic specialist within two weeks. Return: Return to the ED for numbness, weakness, increasing pain, overall worsening symptoms, loss of function, or if symptoms are not improving, you have tried to follow up with the orthopedic specialist, and have been unable to do so.  For prescription assistance, may try using prescription discount sites or apps, such as goodrx.com

## 2020-01-02 NOTE — ED Notes (Signed)
Also reports mild left ankle pain

## 2020-01-03 LAB — RPR
RPR Ser Ql: REACTIVE — AB
RPR Titer: 1:1 {titer}

## 2020-01-03 LAB — HIV ANTIBODY (ROUTINE TESTING W REFLEX): HIV Screen 4th Generation wRfx: NONREACTIVE

## 2020-01-03 LAB — URINE CULTURE: Culture: <10000 — AB

## 2020-01-06 LAB — GC/CHLAMYDIA PROBE AMP (~~LOC~~) NOT AT ARMC
Chlamydia: NEGATIVE
Neisseria Gonorrhea: POSITIVE — AB

## 2020-01-06 LAB — T.PALLIDUM AB, TOTAL: T Pallidum Abs: NONREACTIVE

## 2021-04-15 ENCOUNTER — Encounter (HOSPITAL_COMMUNITY): Payer: Self-pay | Admitting: Emergency Medicine

## 2021-04-15 ENCOUNTER — Ambulatory Visit (HOSPITAL_COMMUNITY)
Admission: EM | Admit: 2021-04-15 | Discharge: 2021-04-15 | Disposition: A | Discharge status: Home / Self Care | Payer: Self-pay

## 2021-04-15 ENCOUNTER — Other Ambulatory Visit: Payer: Self-pay

## 2021-04-15 DIAGNOSIS — T161XXA Foreign body in right ear, initial encounter: Secondary | ICD-10-CM

## 2021-04-15 NOTE — Discharge Instructions (Addendum)
Please return for any worsening pain or fever

## 2021-04-15 NOTE — ED Provider Notes (Signed)
[facility]    CSN: [id number] Arrival date & time: 04/15/21  1533      History   Chief Complaint Chief Complaint  Patient presents with  . Foreign Body in Ear    HPI JENTRY WARNELL is a 25 y.o. male and right ear.  Patient states earbud cover lodged in his right ear causing 9 /10 pain.  Incident occurred just prior to arrival.    History reviewed. No pertinent past medical history.  There are no problems to display for this patient.   History reviewed. No pertinent surgical history.     Home Medications    Prior to Admission medications   Medication Sig Start Date End Date Taking? Authorizing Provider  ibuprofen (ADVIL) 800 MG tablet Take 1 tablet (800 mg total) by mouth 3 (three) times daily. 11/04/19   Roxy Horseman, PA-C  lidocaine (LIDODERM) 5 % Place 1 patch onto the skin daily. Remove & Discard patch within 12 hours or as directed by MD 01/02/20   Joy, Shawn C, PA-C  methocarbamol (ROBAXIN) 500 MG tablet Take 1 tablet (500 mg total) by mouth 2 (two) times daily. 01/02/20   Anselm Pancoast, PA-C    Family History History reviewed. No pertinent family history.  Social History Social History   Tobacco Use  . Smoking status: Never Smoker  . Smokeless tobacco: Never Used  Substance Use Topics  . Alcohol use: No  . Drug use: No     Allergies   Patient has no known allergies.   Review of Systems Stated in HPI otherwise negative   Physical Exam Triage Vital Signs ED Triage Vitals  Enc Vitals Group     BP 04/15/21 1641 (!) 143/78     Pulse Rate 04/15/21 1641 79     Resp 04/15/21 1641 20     Temp 04/15/21 1641 98.7 F (37.1 C)     Temp Source 04/15/21 1641 Oral     SpO2 04/15/21 1641 98 %     Weight --      Height --      Head Circumference --      Peak Flow --      Pain Score 04/15/21 1642 9     Pain Loc --      Pain Edu? --      Excl. in GC? --    No data found.  Updated Vital Signs BP (!) 143/78 (BP Location: Left Arm)    Pulse 79   Temp 98.7 F (37.1 C) (Oral)   Resp 20   SpO2 98%   Visual Acuity Right Eye Distance:   Left Eye Distance:   Bilateral Distance:    Right Eye Near:   Left Eye Near:    Bilateral Near:     Physical Exam Constitutional:      General: He is not in acute distress.    Appearance: Normal appearance. He is not ill-appearing.  HENT:     Left Ear: Tympanic membrane normal.     Ears:     Comments: Foreign body in ear canal easily removed c hemostats.  Mild erythema to ear canal.  TM intact without any erythema or perforation Neurological:     Mental Status: He is alert.      UC Treatments / Results  Labs (all labs ordered are listed, but only abnormal results are displayed) Labs Reviewed - No data to display  EKG   Radiology No results found.  Procedures Procedures (including critical  care time)  Medications Ordered in UC Medications - No data to display  Initial Impression / Assessment and Plan / UC Course  I have reviewed the triage vital signs and the nursing notes.  Pertinent labs & imaging results that were available during my care of the patient were reviewed by me and considered in my medical decision making (see chart for details).  Foreign body in ear -Removed without evidence of retained foreign body -No evidence of TM perforation post removal -Follow-up as needed  Reviewed expections re: course of current medical issues. Questions answered. Outlined signs and symptoms indicating need for more acute intervention. Pt verbalized understanding. AVS given  Final Clinical Impressions(s) / UC Diagnoses   Final diagnoses:  Foreign body of right ear, initial encounter     Discharge Instructions     Please return for any worsening pain or fever    ED Prescriptions    None     PDMP not reviewed this encounter.   Rolla Etienne, NP 04/15/21 2225

## 2021-04-15 NOTE — ED Triage Notes (Signed)
Pt reports ear budd is lodged in his right ear onset today around 1200  Sx include pain = 9/10  A&O x4... NAD.Marland Kitchen. ambulatory

## 2021-08-15 IMAGING — CR DG ANKLE COMPLETE 3+V*L*
3 series · 3 of 3 positions shown · non-contrast
Comparison: None.

CLINICAL DATA: Pain.

EXAM:
LEFT ANKLE COMPLETE - 3+ VIEW

[ankle ap]
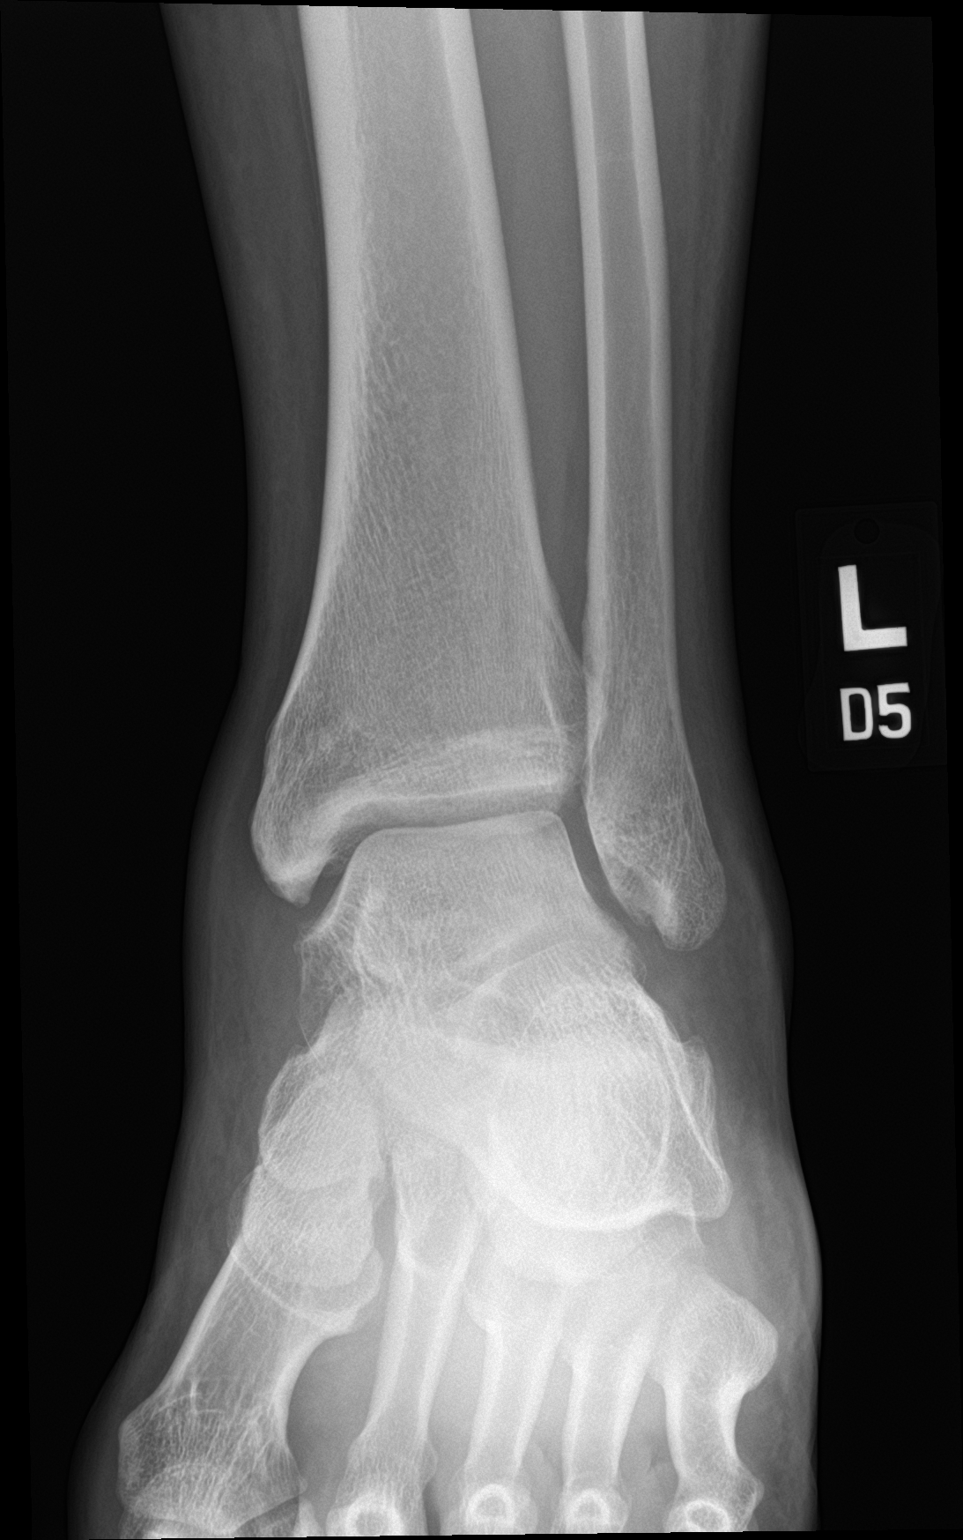

[ankle obl]
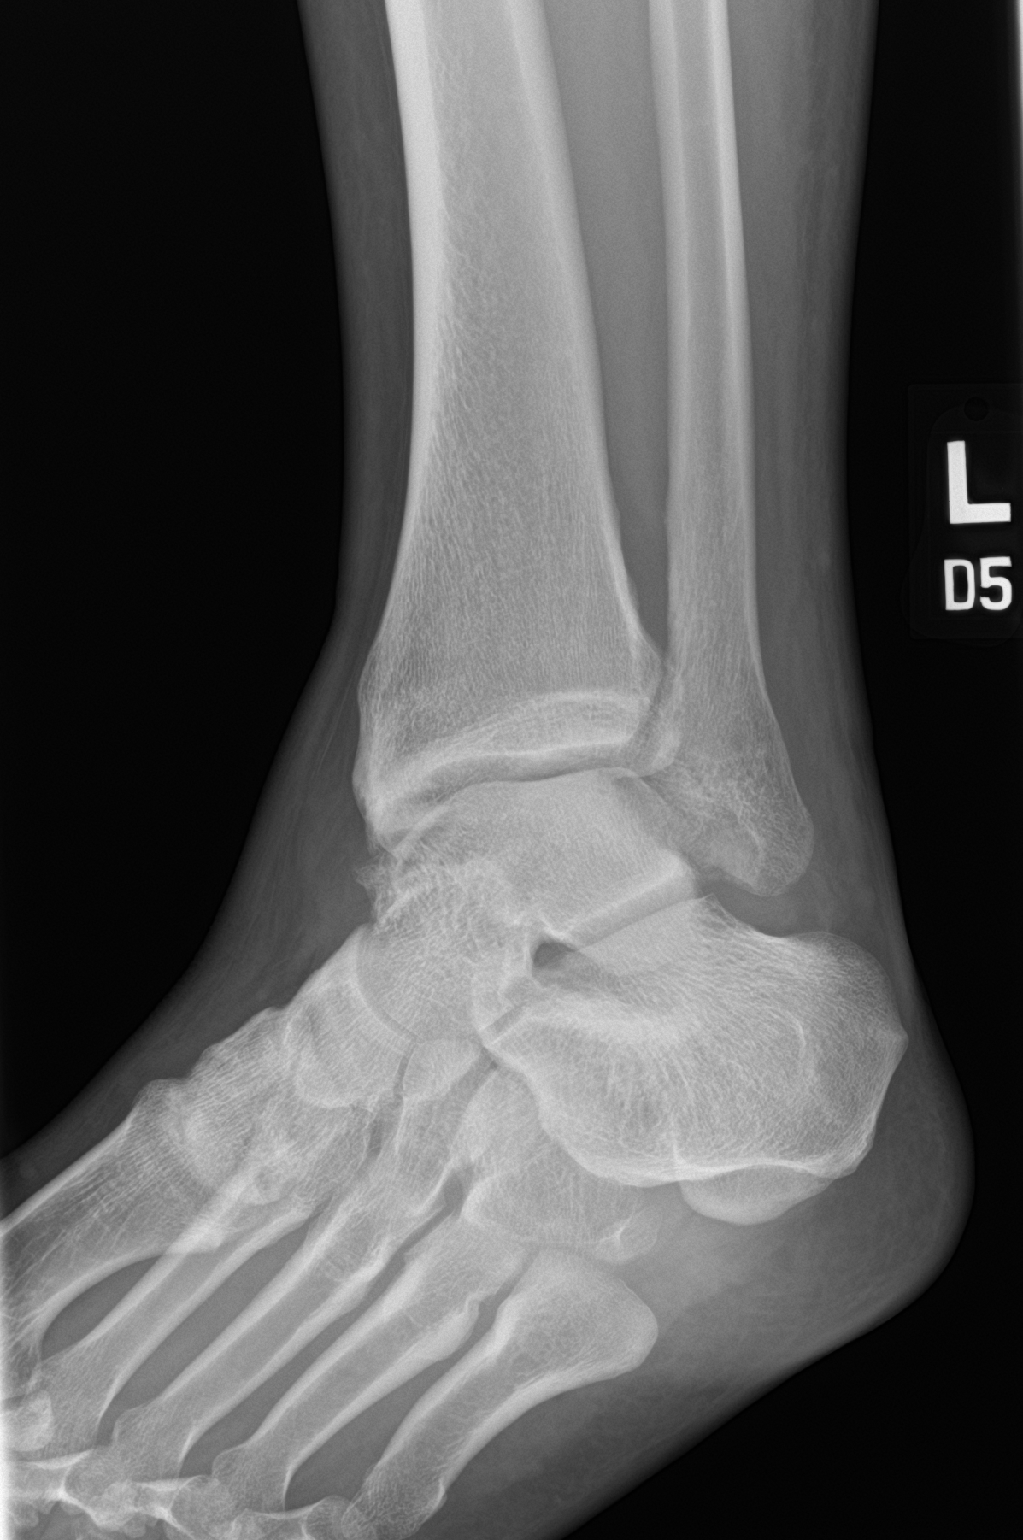

[ankle lat]
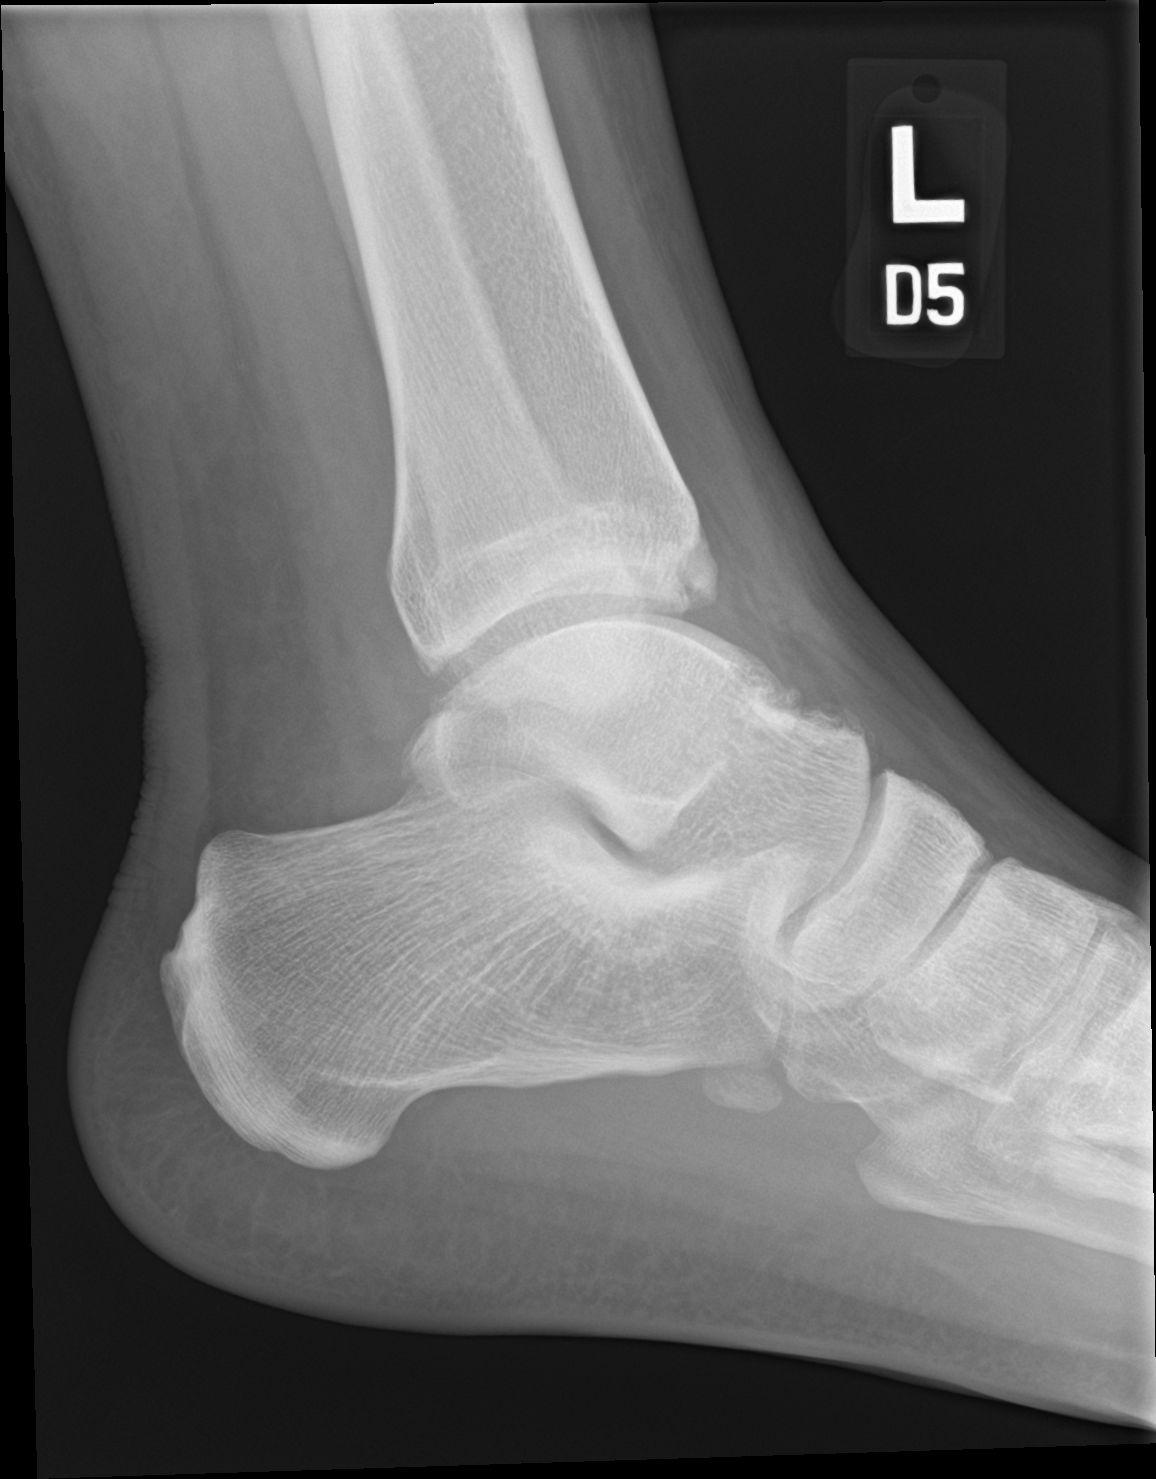

[3 of 3 positions shown; findings below may reference images not displayed]

FINDINGS: There is mild soft tissue swelling about the ankle without evidence
for an acute displaced fracture or dislocation.
IMPRESSION: No evidence for an acute displaced fracture or dislocation.

## 2021-12-16 ENCOUNTER — Encounter (HOSPITAL_COMMUNITY): Payer: Self-pay

## 2021-12-16 ENCOUNTER — Ambulatory Visit (HOSPITAL_COMMUNITY)
Admission: EM | Admit: 2021-12-16 | Discharge: 2021-12-16 | Disposition: A | Discharge status: Home / Self Care | Payer: 59 | Attending: Family Medicine | Admitting: Family Medicine

## 2021-12-16 ENCOUNTER — Other Ambulatory Visit: Payer: Self-pay

## 2021-12-16 DIAGNOSIS — A549 Gonococcal infection, unspecified: Secondary | ICD-10-CM | POA: Diagnosis not present

## 2021-12-16 DIAGNOSIS — Z711 Person with feared health complaint in whom no diagnosis is made: Secondary | ICD-10-CM

## 2021-12-16 DIAGNOSIS — Z202 Contact with and (suspected) exposure to infections with a predominantly sexual mode of transmission: Secondary | ICD-10-CM | POA: Insufficient documentation

## 2021-12-16 MED ORDER — LIDOCAINE HCL (PF) 1 % IJ SOLN
INTRAMUSCULAR | Status: AC
  Filled 2021-12-16: qty 2

## 2021-12-16 MED ORDER — CEFTRIAXONE SODIUM 500 MG IJ SOLR
500.0000 mg | Freq: Once | INTRAMUSCULAR | Status: AC
  Administered 2021-12-16: 500 mg via INTRAMUSCULAR

## 2021-12-16 MED ORDER — CEFTRIAXONE SODIUM 500 MG IJ SOLR
INTRAMUSCULAR | Status: AC
  Filled 2021-12-16: qty 500

## 2021-12-16 MED ORDER — DOXYCYCLINE HYCLATE 100 MG PO CAPS: 100.0000 mg | ORAL_CAPSULE | Freq: Two times a day (BID) | ORAL | 0 refills | Status: DC

## 2021-12-16 NOTE — ED Provider Notes (Signed)
°  [name]   [id number] 12/16/21 Arrival Time: [id number]  ASSESSMENT & PLAN:  1. Concern about STD in male without diagnosis       Discharge Instructions      You have been given the following today for treatment of suspected gonorrhea and/or chlamydia:  cefTRIAXone (ROCEPHIN) injection 500 mg  Please pick up your prescription for doxycycline 100 mg and begin taking twice daily for the next seven (7) days.  Even though we have treated you today, we have sent testing for sexually transmitted infections. We will notify you of any positive results once they are received. If required, we will prescribe any medications you might need.  Please refrain from all sexual activity for at least the next seven days.     Pending: Labs Reviewed  CYTOLOGY, (ORAL, ANAL, URETHRAL) ANCILLARY ONLY    Will notify of any positive results. Instructed to refrain from sexual activity for at least seven days.  Reviewed expectations re: course of current medical issues. Questions answered. Outlined signs and symptoms indicating need for more acute intervention. Patient verbalized understanding. After Visit Summary given.   SUBJECTIVE:  Alejandro Olson is a 26 y.o. male who presents with complaint of penile discharge. Onset abrupt. First noticed  this week . Describes discharge as thick and opaque. No specific aggravating or alleviating factors reported. Denies: urinary frequency, dysuria, and gross hematuria. Afebrile. No abdominal or pelvic pain. No n/v. No rashes or lesions. Reports that he is sexually active OTC treatment: none. History of STI: yes.   OBJECTIVE:  Vitals:   12/16/21 1647  BP: 137/88  Pulse: 91  Resp: 18  Temp: 98.2 F (36.8 C)  TempSrc: Oral  SpO2: 96%     General appearance: alert, cooperative, appears stated age and no distress Throat: lips, mucosa, and tongue normal; teeth and gums normal Lungs: unlabored respirations; speaks full sentences without  difficulty Back: no CVA tenderness; FROM at waist Abdomen: soft, non-tender GU: normal appearing genitalia Skin: warm and dry Psychological: alert and cooperative; normal mood and affect.    Labs Reviewed  CYTOLOGY, (ORAL, ANAL, URETHRAL) ANCILLARY ONLY    No Known Allergies  History reviewed. No pertinent past medical history. History reviewed. No pertinent family history. Social History   Socioeconomic History   Marital status: Single    Spouse name: Not on file   Number of children: Not on file   Years of education: Not on file   Highest education level: Not on file  Occupational History   Not on file  Tobacco Use   Smoking status: Never   Smokeless tobacco: Never  Substance and Sexual Activity   Alcohol use: No   Drug use: No   Sexual activity: Not on file  Other Topics Concern   Not on file  Social History Narrative   Not on file   Social Determinants of Health   Financial Resource Strain: Not on file  Food Insecurity: Not on file  Transportation Needs: Not on file  Physical Activity: Not on file  Stress: Not on file  Social Connections: Not on file  Intimate Partner Violence: Not on file           Vanessa Kick, MD 12/16/21 1728

## 2021-12-16 NOTE — Discharge Instructions (Addendum)

## 2021-12-16 NOTE — ED Triage Notes (Signed)
Pt presents with c/o penile discharge and states he was exposed to a partner that has Chlamydia.  States he needs testing.

## 2021-12-17 LAB — CYTOLOGY, (ORAL, ANAL, URETHRAL) ANCILLARY ONLY
Chlamydia: NEGATIVE
Comment: NEGATIVE
Comment: NEGATIVE
Comment: NORMAL
Neisseria Gonorrhea: POSITIVE — AB
Trichomonas: NEGATIVE

## 2023-08-27 ENCOUNTER — Ambulatory Visit (HOSPITAL_COMMUNITY)
Admission: EM | Admit: 2023-08-27 | Discharge: 2023-08-27 | Disposition: A | Discharge status: Home / Self Care | Payer: 59 | Attending: Family Medicine | Admitting: Family Medicine

## 2023-08-27 ENCOUNTER — Encounter (HOSPITAL_COMMUNITY): Payer: Self-pay | Admitting: Emergency Medicine

## 2023-08-27 ENCOUNTER — Other Ambulatory Visit: Payer: Self-pay

## 2023-08-27 DIAGNOSIS — N342 Other urethritis: Secondary | ICD-10-CM | POA: Diagnosis not present

## 2023-08-27 MED ORDER — CEFTRIAXONE SODIUM 500 MG IJ SOLR
500.0000 mg | INTRAMUSCULAR | Status: DC
  Administered 2023-08-27: 500 mg via INTRAMUSCULAR

## 2023-08-27 MED ORDER — LIDOCAINE HCL (PF) 1 % IJ SOLN
INTRAMUSCULAR | Status: AC
  Filled 2023-08-27: qty 2

## 2023-08-27 MED ORDER — CEFTRIAXONE SODIUM 500 MG IJ SOLR
INTRAMUSCULAR | Status: AC
  Filled 2023-08-27: qty 500

## 2023-08-27 NOTE — Discharge Instructions (Signed)
You have been given a shot of ceftriaxone 500 mg; this is for potential gonorrhea.  Staff will notify you if there is anything positive on the swab.

## 2023-08-27 NOTE — ED Triage Notes (Signed)
Pt having Penile discharge since yesterday.

## 2023-08-27 NOTE — ED Provider Notes (Addendum)
[facility]    CSN: [id number] Arrival date & time: 08/27/23  1215      History   Chief Complaint Chief Complaint  Patient presents with   Penile Discharge    HPI Alejandro Olson is a 27 y.o. male.    Penile Discharge  Here for penile discharge it has been going on for about 2 days.  No dysuria or itching.  No fever or abdominal pain or vomiting.  He is not allergic to any medications   History reviewed. No pertinent past medical history.  There are no problems to display for this patient.   History reviewed. No pertinent surgical history.     Home Medications    Prior to Admission medications   Not on File    Family History History reviewed. No pertinent family history.  Social History Social History   Tobacco Use   Smoking status: Never   Smokeless tobacco: Never  Substance Use Topics   Alcohol use: No   Drug use: No     Allergies   Patient has no known allergies.   Review of Systems Review of Systems  Genitourinary:  Positive for penile discharge.     Physical Exam Triage Vital Signs ED Triage Vitals  Encounter Vitals Group     BP 08/27/23 1305 128/84     Systolic BP Percentile --      Diastolic BP Percentile --      Pulse Rate 08/27/23 1305 100     Resp 08/27/23 1305 18     Temp 08/27/23 1305 98.3 F (36.8 C)     Temp Source 08/27/23 1305 Oral     SpO2 08/27/23 1305 99 %     Weight --      Height --      Head Circumference --      Peak Flow --      Pain Score 08/27/23 1307 0     Pain Loc --      Pain Education --      Exclude from Growth Chart --    No data found.  Updated Vital Signs BP 128/84 (BP Location: Right Arm)   Pulse 100   Temp 98.3 F (36.8 C) (Oral)   Resp 18   SpO2 99%   Visual Acuity Right Eye Distance:   Left Eye Distance:   Bilateral Distance:    Right Eye Near:   Left Eye Near:    Bilateral Near:     Physical Exam Vitals reviewed.  Constitutional:      General: He is not in  acute distress.    Appearance: He is not toxic-appearing.  Skin:    Coloration: Skin is not jaundiced or pale.  Neurological:     Mental Status: He is oriented to person, place, and time.  Psychiatric:        Behavior: Behavior normal.      UC Treatments / Results  Labs (all labs ordered are listed, but only abnormal results are displayed) Labs Reviewed  CYTOLOGY, (ORAL, ANAL, URETHRAL) ANCILLARY ONLY    EKG   Radiology No results found.  Procedures Procedures (including critical care time)  Medications Ordered in UC Medications  cefTRIAXone (ROCEPHIN) injection 500 mg (has no administration in time range)    Initial Impression / Assessment and Plan / UC Course  I have reviewed the triage vital signs and the nursing notes.  Pertinent labs & imaging results that were available during my care of the patient were  reviewed by me and considered in my medical decision making (see chart for details).        Urethral self swab is done and staff will notify him of any positives and treat per protocol. We discussed options, and decided on doing empiric treatment with Rocephin.  He wanted to wait on any further prescriptions until he had the swab results.  He declines my offer of HIV and syphilis testing. Final Clinical Impressions(s) / UC Diagnoses   Final diagnoses:  Urethritis     Discharge Instructions      You have been given a shot of ceftriaxone 500 mg; this is for potential gonorrhea.  Staff will notify you if there is anything positive on the swab.      ED Prescriptions   None    PDMP not reviewed this encounter.   Zenia Resides, MD 08/27/23 1346    Zenia Resides, MD 08/27/23 1346

## 2023-08-28 ENCOUNTER — Telehealth (HOSPITAL_COMMUNITY): Payer: Self-pay | Admitting: Radiology

## 2023-08-28 NOTE — Telephone Encounter (Signed)
Main lab called saying they are unable to run patients cytology swab because it was missing all the fluid from the tube. attempped to call patient twice to have them come back in for a reswab. Left a voicemail asking patient to call back.
